# Patient Record
Sex: Male | Born: 1995 | Race: White | Hispanic: No | Marital: Single | Smoking: Never smoker
Health system: Southern US, Community
[De-identification: ages and names within clinical notes are randomized; demographics above are authoritative.]

---
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
## 2016-08-15 ENCOUNTER — Encounter: Payer: Self-pay | Admitting: *Deleted

## 2016-08-15 ENCOUNTER — Ambulatory Visit
Admission: EM | Admit: 2016-08-15 | Discharge: 2016-08-15 | Disposition: A | Discharge status: Home / Self Care | Payer: 59 | Attending: Family Medicine | Admitting: Family Medicine

## 2016-08-15 DIAGNOSIS — M5442 Lumbago with sciatica, left side: Secondary | ICD-10-CM

## 2016-08-15 DIAGNOSIS — M545 Low back pain: Secondary | ICD-10-CM | POA: Diagnosis not present

## 2016-08-15 HISTORY — DX: Rider (driver) (passenger) of other motorcycle injured in unspecified traffic accident, initial encounter: V29.99XA

## 2016-08-15 MED ORDER — MELOXICAM 15 MG PO TABS: 15.0000 mg | ORAL_TABLET | Freq: Every day | ORAL | 0 refills | Status: AC | PRN

## 2016-08-15 MED ORDER — KETOROLAC TROMETHAMINE 60 MG/2ML IM SOLN: 60.0000 mg | Freq: Once | INTRAMUSCULAR | Status: DC

## 2016-08-15 MED ORDER — CYCLOBENZAPRINE HCL 10 MG PO TABS: 10.0000 mg | ORAL_TABLET | Freq: Two times a day (BID) | ORAL | 0 refills | Status: AC | PRN

## 2016-08-15 NOTE — ED Triage Notes (Signed)
Patient started having lower back pain while working in his basement last pm.

## 2016-08-15 NOTE — Discharge Instructions (Signed)
Take medication as prescribed. Rest. Drink plenty of fluids. Avoid strenuous activity. Stretch.   Follow up with your primary care physician this week as needed. Return to Urgent care for new or worsening concerns.
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
## 2018-11-07 DIAGNOSIS — Z20828 Contact with and (suspected) exposure to other viral communicable diseases: Secondary | ICD-10-CM | POA: Diagnosis not present

## 2019-01-28 DIAGNOSIS — Z20828 Contact with and (suspected) exposure to other viral communicable diseases: Secondary | ICD-10-CM | POA: Diagnosis not present

## 2019-04-01 DIAGNOSIS — Z20828 Contact with and (suspected) exposure to other viral communicable diseases: Secondary | ICD-10-CM | POA: Diagnosis not present

## 2019-04-01 DIAGNOSIS — R070 Pain in throat: Secondary | ICD-10-CM | POA: Diagnosis not present

## 2019-04-01 DIAGNOSIS — J020 Streptococcal pharyngitis: Secondary | ICD-10-CM | POA: Diagnosis not present

## 2019-04-11 DIAGNOSIS — Z03818 Encounter for observation for suspected exposure to other biological agents ruled out: Secondary | ICD-10-CM | POA: Diagnosis not present

## 2019-09-19 DIAGNOSIS — E860 Dehydration: Secondary | ICD-10-CM | POA: Diagnosis not present

## 2019-09-19 DIAGNOSIS — R002 Palpitations: Secondary | ICD-10-CM | POA: Diagnosis not present

## 2019-09-19 DIAGNOSIS — R079 Chest pain, unspecified: Secondary | ICD-10-CM | POA: Diagnosis not present

## 2019-09-19 DIAGNOSIS — R071 Chest pain on breathing: Secondary | ICD-10-CM | POA: Diagnosis not present

## 2020-01-25 DIAGNOSIS — L03818 Cellulitis of other sites: Secondary | ICD-10-CM | POA: Diagnosis not present

## 2021-09-05 ENCOUNTER — Ambulatory Visit (INDEPENDENT_AMBULATORY_CARE_PROVIDER_SITE_OTHER): Payer: 59

## 2021-09-05 ENCOUNTER — Other Ambulatory Visit: Payer: Self-pay

## 2021-09-05 ENCOUNTER — Ambulatory Visit
Admission: EM | Admit: 2021-09-05 | Discharge: 2021-09-05 | Disposition: A | Discharge status: Home / Self Care | Payer: 59 | Attending: Emergency Medicine | Admitting: Emergency Medicine

## 2021-09-05 DIAGNOSIS — M25571 Pain in right ankle and joints of right foot: Secondary | ICD-10-CM | POA: Diagnosis not present

## 2021-09-05 DIAGNOSIS — S93491A Sprain of other ligament of right ankle, initial encounter: Secondary | ICD-10-CM | POA: Diagnosis not present

## 2021-09-05 NOTE — ED Triage Notes (Signed)
Patient c.o Right ankle injury -- yesterday.  Patient was moving furniture from the the third floor of an apartment complex and came down on it wrong.   Patient also c/o a rash around his right eye -- been there about 3 weeks.

## 2021-09-05 NOTE — Discharge Instructions (Addendum)
-  Ankle brace while pain persists -You can take Tylenol up to 1000 mg 3 times daily, and ibuprofen up to 600 mg 3 times daily with food.  You can take these together, or alternate every 3-4 hours. -Rest, ice elevation
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
## 2022-03-11 ENCOUNTER — Encounter: Payer: Self-pay | Admitting: Emergency Medicine

## 2022-03-11 ENCOUNTER — Ambulatory Visit (INDEPENDENT_AMBULATORY_CARE_PROVIDER_SITE_OTHER): Payer: 59

## 2022-03-11 ENCOUNTER — Ambulatory Visit
Admission: EM | Admit: 2022-03-11 | Discharge: 2022-03-11 | Disposition: A | Discharge status: Home / Self Care | Payer: 59 | Attending: Physician Assistant | Admitting: Physician Assistant

## 2022-03-11 DIAGNOSIS — R079 Chest pain, unspecified: Secondary | ICD-10-CM | POA: Diagnosis not present

## 2022-03-11 DIAGNOSIS — R509 Fever, unspecified: Secondary | ICD-10-CM | POA: Insufficient documentation

## 2022-03-11 DIAGNOSIS — Z1152 Encounter for screening for COVID-19: Secondary | ICD-10-CM | POA: Insufficient documentation

## 2022-03-11 LAB — URINALYSIS, ROUTINE W REFLEX MICROSCOPIC
Bilirubin Urine: NEGATIVE
Glucose, UA: NEGATIVE mg/dL
Hgb urine dipstick: NEGATIVE
Ketones, ur: NEGATIVE mg/dL
Leukocytes,Ua: NEGATIVE
Nitrite: NEGATIVE
Protein, ur: NEGATIVE mg/dL
Specific Gravity, Urine: 1.015 (ref 1.005–1.030)
pH: 6 (ref 5.0–8.0)

## 2022-03-11 LAB — RESP PANEL BY RT-PCR (FLU A&B, COVID) ARPGX2
Influenza A by PCR: NEGATIVE
Influenza B by PCR: NEGATIVE
SARS Coronavirus 2 by RT PCR: NEGATIVE
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
## 2022-03-11 NOTE — Discharge Instructions (Signed)
You were seen today for fever.  We ruled out COVID, flu, urinary tract infection and pneumonia.  You may alternate ibuprofen and Tylenol OTC according to the package directions.  If symptoms persist or worsen, we recommend you follow back up at urgent care.

## 2022-03-11 NOTE — ED Triage Notes (Signed)
Patient c/o fever that started on Tuesday.  Patient reports some nausea.  Patient denies any other cold symptoms.

## 2023-03-22 IMAGING — CR DG ANKLE COMPLETE 3+V*R*
3 series · 3 of 3 positions shown · non-contrast
Comparison: None Available.

CLINICAL DATA: Right ankle injury while moving furniture yesterday.
Right ankle pain and swelling.

EXAM:
RIGHT ANKLE - COMPLETE 3+ VIEW

[ankle ap]
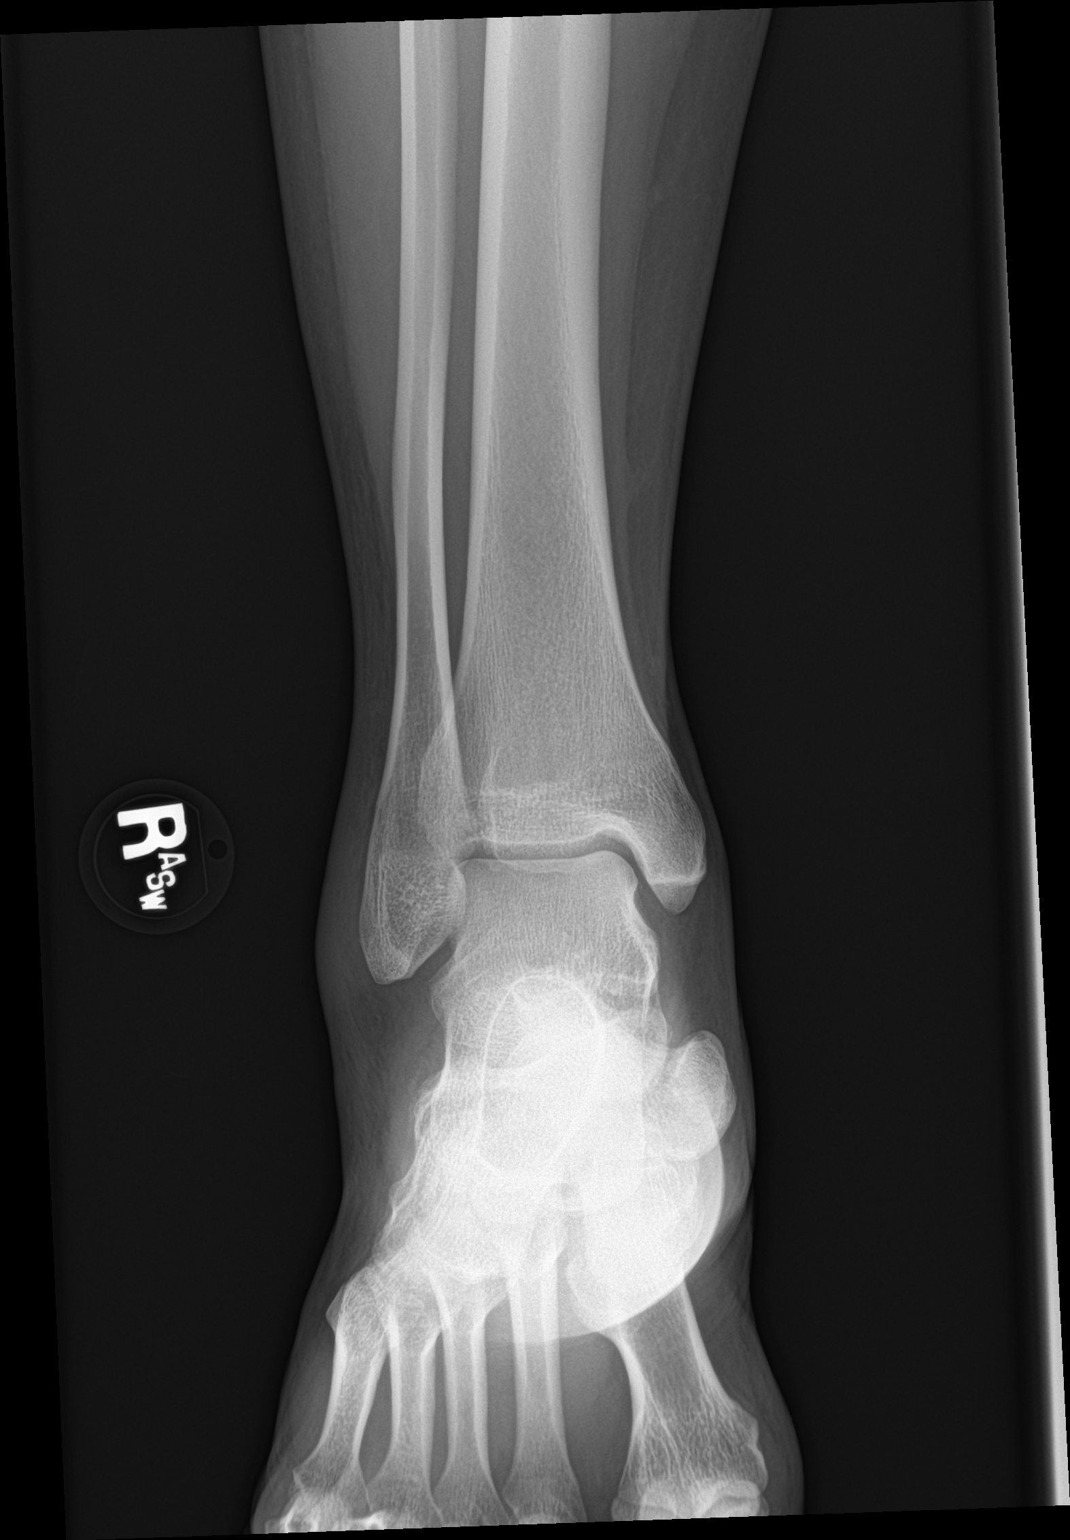

[ankle obl]
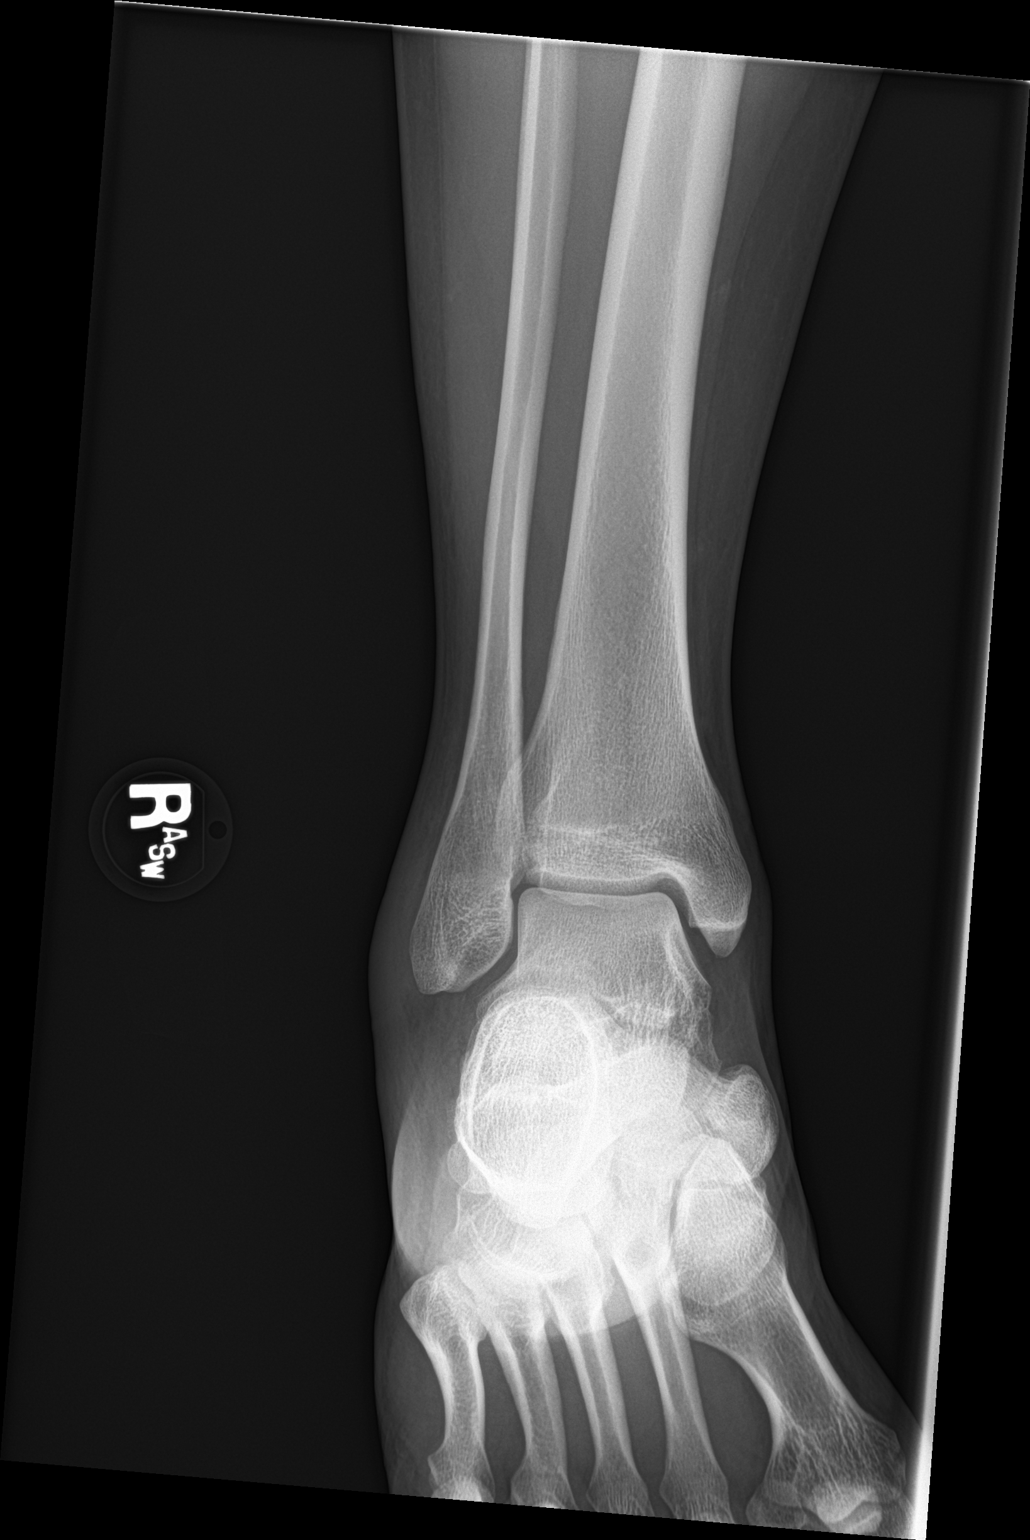

[ankle lat]
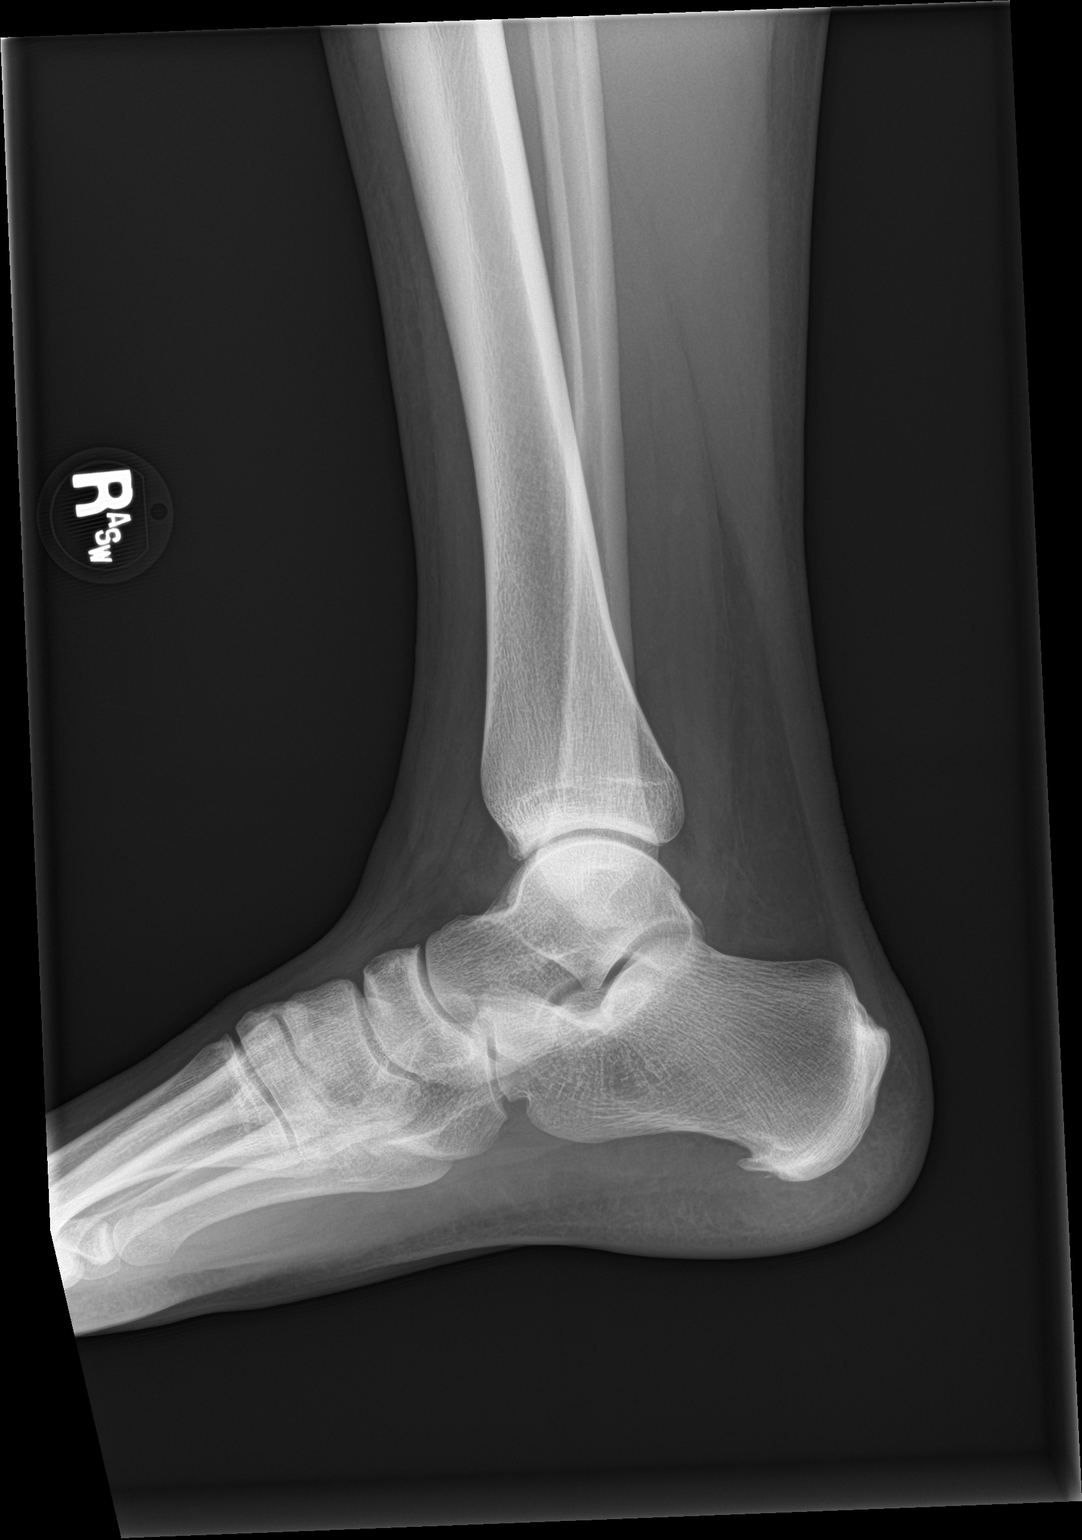

[3 of 3 positions shown; findings below may reference images not displayed]

FINDINGS: There is no evidence of fracture, dislocation, or joint effusion.
There is no evidence of arthropathy. Small plantar calcaneal bone
spur incidentally noted. Lateral soft tissue swelling noted.
IMPRESSION: Lateral soft tissue swelling. No evidence of fracture.
